# Patient Record
Sex: Female | Born: 1965 | Race: Black or African American | Hispanic: No | Marital: Single | Smoking: Never smoker
Health system: Southern US, Community
[De-identification: ages and names within clinical notes are randomized; demographics above are authoritative.]

## PROBLEM LIST (undated history)

## (undated) DIAGNOSIS — F32A Depression, unspecified: Secondary | ICD-10-CM

## (undated) DIAGNOSIS — G43909 Migraine, unspecified, not intractable, without status migrainosus: Secondary | ICD-10-CM

## (undated) DIAGNOSIS — F329 Major depressive disorder, single episode, unspecified: Secondary | ICD-10-CM

## (undated) DIAGNOSIS — F419 Anxiety disorder, unspecified: Secondary | ICD-10-CM

## (undated) DIAGNOSIS — K219 Gastro-esophageal reflux disease without esophagitis: Secondary | ICD-10-CM

## (undated) HISTORY — PX: ABDOMINAL HYSTERECTOMY: SHX81

## (undated) HISTORY — PX: BACK SURGERY: SHX140

## (undated) HISTORY — PX: TONSILLECTOMY: SUR1361

---
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
## 2010-08-27 DEATH — deceased

## 2018-11-28 ENCOUNTER — Emergency Department (HOSPITAL_BASED_OUTPATIENT_CLINIC_OR_DEPARTMENT_OTHER)
Admission: EM | Admit: 2018-11-28 | Discharge: 2018-11-28 | Disposition: A | Discharge status: Home / Self Care | Payer: Self-pay | Attending: Emergency Medicine | Admitting: Emergency Medicine

## 2018-11-28 ENCOUNTER — Other Ambulatory Visit: Payer: Self-pay

## 2018-11-28 ENCOUNTER — Encounter (HOSPITAL_BASED_OUTPATIENT_CLINIC_OR_DEPARTMENT_OTHER): Payer: Self-pay

## 2018-11-28 ENCOUNTER — Emergency Department (HOSPITAL_BASED_OUTPATIENT_CLINIC_OR_DEPARTMENT_OTHER): Payer: Self-pay

## 2018-11-28 DIAGNOSIS — W010XXA Fall on same level from slipping, tripping and stumbling without subsequent striking against object, initial encounter: Secondary | ICD-10-CM | POA: Insufficient documentation

## 2018-11-28 DIAGNOSIS — Y999 Unspecified external cause status: Secondary | ICD-10-CM | POA: Insufficient documentation

## 2018-11-28 DIAGNOSIS — Y939 Activity, unspecified: Secondary | ICD-10-CM | POA: Insufficient documentation

## 2018-11-28 DIAGNOSIS — S0990XA Unspecified injury of head, initial encounter: Secondary | ICD-10-CM

## 2018-11-28 DIAGNOSIS — Y92524 Gas station as the place of occurrence of the external cause: Secondary | ICD-10-CM | POA: Insufficient documentation

## 2018-11-28 DIAGNOSIS — S0083XA Contusion of other part of head, initial encounter: Secondary | ICD-10-CM | POA: Insufficient documentation

## 2018-11-28 DIAGNOSIS — Z79899 Other long term (current) drug therapy: Secondary | ICD-10-CM | POA: Insufficient documentation

## 2018-11-28 DIAGNOSIS — M25561 Pain in right knee: Secondary | ICD-10-CM | POA: Insufficient documentation

## 2018-11-28 HISTORY — DX: Migraine, unspecified, not intractable, without status migrainosus: G43.909

## 2018-11-28 HISTORY — DX: Gastro-esophageal reflux disease without esophagitis: K21.9

## 2018-11-28 HISTORY — DX: Depression, unspecified: F32.A

## 2018-11-28 HISTORY — DX: Anxiety disorder, unspecified: F41.9

## 2018-11-28 MED ORDER — PROCHLORPERAZINE MALEATE 10 MG PO TABS
10.0000 mg | ORAL_TABLET | Freq: Once | ORAL | Status: AC
  Administered 2018-11-28: 13:00:00 10 mg via ORAL
  Filled 2018-11-28: qty 1

## 2018-11-28 MED ORDER — ACETAMINOPHEN 500 MG PO TABS
1000.0000 mg | ORAL_TABLET | Freq: Once | ORAL | Status: AC
  Administered 2018-11-28: 1000 mg via ORAL
  Filled 2018-11-28: qty 2

## 2018-11-28 NOTE — Discharge Instructions (Signed)
You can take ibuprofen 600 mg every 6 hours for the next week for your headache.  You can apply ice to your forehead as needed at home.

## 2018-11-28 NOTE — ED Provider Notes (Signed)
[facility] EMERGENCY DEPARTMENT Provider Note   CSN: [id number] Arrival date & time: 11/28/18  1211     History   Chief Complaint Chief Complaint  Patient presents with  . Fall  . Headache    HPI Chelbi Lusher is a 53 y.o. female with a history of migraines presenting to emergency department mechanical fall.  She reports she was at the gas station and lost her footing and tripped over the curb.  She fell onto her right knee and struck her right forehead on the ground.  There is no loss of consciousness.  She was able to ambulate afterwards.  She describes swelling in her forehead which is painful as well as worsening headache.  She denies any neck pain or stiffness.  She reports pain in her right knee but otherwise denies any other arthralgias.  She is not on blood thinners.  NKDA     HPI  Past Medical History:  Diagnosis Date  . Anxiety   . Depression   . GERD (gastroesophageal reflux disease)   . Migraine     There are no active problems to display for this patient.    OB History    Gravida  3   Para  3   Term  3   Preterm  0   AB  0   Living  0     SAB  0   TAB  0   Ectopic  0   Multiple  0   Live Births  3            Home Medications    Prior to Admission medications   Medication Sig Start Date End Date Taking? Authorizing Provider  pantoprazole (PROTONIX) 20 MG tablet Take 20 mg by mouth daily.   Yes [provider]    Family History No family history on file.  Social History Social History   Tobacco Use  . Smoking status: Never Smoker  . Smokeless tobacco: Never Used  Substance Use Topics  . Alcohol use: Never    Frequency: Never  . Drug use: Never     Allergies   Patient has no known allergies.   Review of Systems Review of Systems  Constitutional: Negative for chills and fever.  Eyes: Positive for photophobia. Negative for visual disturbance.  Respiratory: Negative for cough and shortness  of breath.   Cardiovascular: Negative for chest pain and palpitations.  Gastrointestinal: Positive for nausea. Negative for vomiting.  Genitourinary: Negative for dysuria and hematuria.  Musculoskeletal: Positive for arthralgias and myalgias. Negative for neck pain and neck stiffness.  Skin: Positive for wound. Negative for rash.  Neurological: Positive for light-headedness and headaches. Negative for seizures, syncope and speech difficulty.  Psychiatric/Behavioral: Negative for agitation and confusion.  All other systems reviewed and are negative.    Physical Exam Updated Vital Signs BP 139/77 (BP Location: Right Arm)   Pulse 89   Temp 97.8 F (36.6 C) (Oral)   Resp 14   Ht 5\' 1"  (1.549 m)   Wt 73.5 kg   SpO2 99%   BMI 30.61 kg/m   Physical Exam Vitals signs and nursing note reviewed.  Constitutional:      General: She is not in acute distress.    Appearance: She is well-developed.     Comments: Lying with eyes closed  HENT:     Head: Normocephalic. No raccoon eyes, Battle's sign or laceration.     Jaw: No trismus or tenderness.  Comments: Large hematoma overlying right forehead    Right Ear: Hearing and tympanic membrane normal.     Left Ear: Hearing and tympanic membrane normal.  Eyes:     Conjunctiva/sclera: Conjunctivae normal.  Neck:     Musculoskeletal: Normal range of motion and neck supple. No neck rigidity.  Cardiovascular:     Rate and Rhythm: Normal rate and regular rhythm.     Heart sounds: Normal heart sounds.  Pulmonary:     Effort: Pulmonary effort is normal. No respiratory distress.     Breath sounds: Normal breath sounds.  Abdominal:     Palpations: Abdomen is soft.     Tenderness: There is no abdominal tenderness.  Musculoskeletal:     Comments: Abrasion and tenderness of the right patella   Skin:    General: Skin is warm and dry.  Neurological:     Mental Status: She is alert.     GCS: GCS eye subscore is 4. GCS verbal subscore is 5. GCS  motor subscore is 6.     Cranial Nerves: No cranial nerve deficit.  Psychiatric:        Mood and Affect: Mood normal.        Behavior: Behavior normal.      ED Treatments / Results  Labs (all labs ordered are listed, but only abnormal results are displayed) Labs Reviewed - No data to display  EKG None  Radiology Dg Knee 2 Views Right  Result Date: 11/28/2018 CLINICAL DATA:  Right knee pain after fall today. EXAM: RIGHT KNEE - 1-2 VIEW COMPARISON:  None. FINDINGS: No evidence of fracture, dislocation, or joint effusion. No evidence of arthropathy or other focal bone abnormality. Soft tissues are unremarkable. IMPRESSION: Negative. Electronically Signed   By: Marijo Conception M.D.   On: 11/28/2018 13:31   Ct Head Wo Contrast  Result Date: 11/28/2018 CLINICAL DATA:  [place] this morning. Hit head. No loss of consciousness. EXAM: CT HEAD WITHOUT CONTRAST TECHNIQUE: Contiguous axial images were obtained from the base of the skull through the vertex without intravenous contrast. COMPARISON:  None. FINDINGS: Brain: No evidence of acute infarction, hemorrhage, hydrocephalus, extra-axial collection or mass lesion/mass effect. Vascular: No hyperdense vessel or unexpected calcification. Skull: Normal. Negative for fracture or focal lesion. Sinuses/Orbits: Globes and orbits are unremarkable. Visualized sinuses show changes from previous surgery, but are clear. Clear mastoid air cells. Other: Small right frontal scalp hematoma. IMPRESSION: 1. No intracranial abnormality. 2. No skull fracture. 3. Small right frontal scalp hematoma. Electronically Signed   By: Lajean Manes M.D.   On: 11/28/2018 13:27    Procedures Procedures (including critical care time)  Medications Ordered in ED Medications  acetaminophen (TYLENOL) tablet 1,000 mg (1,000 mg Oral Given 11/28/18 1252)  prochlorperazine (COMPAZINE) tablet 10 mg (10 mg Oral Given 11/28/18 1253)     Initial Impression / Assessment and Plan / ED Course   I have reviewed the triage vital signs and the nursing notes.  Pertinent labs & imaging results that were available during my care of the patient were reviewed by me and considered in my medical decision making (see chart for details).  53 year old female presenting to the emergency department with mechanical fall from standing height, presenting with large hematoma over right forehead and complaining of headache.  She also has a superficial abrasion with tenderness to the right patella.  There is no other evidence of trauma or injury on my exam.   Plan for CT scan of the head and  we will x-ray the right knee.  There is no significant injury abrasion requiring tetanus at this time.  We will give the patient Tylenol and Compazine p.o. for her headache.  She is negative Nexus criteria for C-spine imaging, I have a very low suspicion for spinal fracture injury at this time.   Clinical Course as of Nov 28 1831  Fri Nov 28, 2018  1341 IMPRESSION: 1. No intracranial abnormality. 2. No skull fracture. 3. Small right frontal scalp hematoma.     [MT]    Clinical Course User Index [MT] Roben Tatsch, Kermit BaloMatthew J, MD     Final Clinical Impressions(s) / ED Diagnoses   Final diagnoses:  Injury of head, initial encounter  Traumatic hematoma of forehead, initial encounter  Acute pain of right knee    ED Discharge Orders    None       Terald Sleeperrifan, Itzia Cunliffe J, MD 11/28/18 [phone]
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
## 2018-11-28 NOTE — ED Notes (Signed)
Pt up for discharge. This RN assisted pt with calling [facility] for safe ride home.

## 2018-12-09 ENCOUNTER — Other Ambulatory Visit: Payer: Self-pay

## 2018-12-09 ENCOUNTER — Encounter (HOSPITAL_COMMUNITY): Payer: Self-pay

## 2018-12-09 ENCOUNTER — Ambulatory Visit (HOSPITAL_COMMUNITY)
Admission: EM | Admit: 2018-12-09 | Discharge: 2018-12-09 | Disposition: A | Discharge status: Home / Self Care | Payer: Self-pay | Attending: Family Medicine | Admitting: Family Medicine

## 2018-12-09 DIAGNOSIS — M545 Low back pain, unspecified: Secondary | ICD-10-CM

## 2018-12-09 MED ORDER — CYCLOBENZAPRINE HCL 5 MG PO TABS: 5.0000 mg | ORAL_TABLET | Freq: Two times a day (BID) | ORAL | 0 refills | Status: AC | PRN

## 2018-12-09 MED ORDER — TRAMADOL HCL 50 MG PO TABS: 50.0000 mg | ORAL_TABLET | Freq: Two times a day (BID) | ORAL | 0 refills | Status: AC | PRN
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
## 2018-12-09 NOTE — Discharge Instructions (Addendum)
Please try heat  Please try the muscle relaxer  Please try ibuprofen  Please try the tramadol for severe pain  Please follow up if your symptoms worsen  Please follow up with sports medicine if your pain is ongoing.
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
